# Patient Record
Sex: Male | Born: 1985 | Race: White | Hispanic: No | Marital: Single | Smoking: Current every day smoker
Health system: Southern US, Community
[De-identification: ages and names within clinical notes are randomized; demographics above are authoritative.]

## PROBLEM LIST (undated history)

## (undated) DIAGNOSIS — S2249XA Multiple fractures of ribs, unspecified side, initial encounter for closed fracture: Secondary | ICD-10-CM

## (undated) DIAGNOSIS — S060XAA Concussion with loss of consciousness status unknown, initial encounter: Secondary | ICD-10-CM

## (undated) DIAGNOSIS — S060X9A Concussion with loss of consciousness of unspecified duration, initial encounter: Secondary | ICD-10-CM

## (undated) DIAGNOSIS — S069X9A Unspecified intracranial injury with loss of consciousness of unspecified duration, initial encounter: Secondary | ICD-10-CM

## (undated) DIAGNOSIS — F419 Anxiety disorder, unspecified: Secondary | ICD-10-CM

## (undated) DIAGNOSIS — K219 Gastro-esophageal reflux disease without esophagitis: Secondary | ICD-10-CM

## (undated) DIAGNOSIS — J45909 Unspecified asthma, uncomplicated: Secondary | ICD-10-CM

## (undated) HISTORY — PX: NO PAST SURGERIES: SHX2092

---

## 2006-02-14 ENCOUNTER — Emergency Department: Payer: Self-pay | Admitting: Emergency Medicine

## 2006-05-18 ENCOUNTER — Emergency Department: Payer: Self-pay | Admitting: Unknown Physician Specialty

## 2008-09-04 ENCOUNTER — Emergency Department: Payer: Self-pay | Admitting: Internal Medicine

## 2008-12-05 ENCOUNTER — Emergency Department: Payer: Self-pay | Admitting: Emergency Medicine

## 2009-01-02 ENCOUNTER — Emergency Department: Payer: Self-pay | Admitting: Emergency Medicine

## 2010-08-23 ENCOUNTER — Emergency Department: Payer: Self-pay | Admitting: Emergency Medicine

## 2011-05-12 ENCOUNTER — Emergency Department: Payer: Self-pay | Admitting: Unknown Physician Specialty

## 2011-05-27 ENCOUNTER — Emergency Department: Payer: Self-pay | Admitting: Internal Medicine

## 2012-08-31 ENCOUNTER — Emergency Department: Payer: Self-pay | Admitting: Emergency Medicine

## 2012-12-30 ENCOUNTER — Emergency Department: Payer: Self-pay | Admitting: Emergency Medicine

## 2013-06-15 ENCOUNTER — Emergency Department: Payer: Self-pay | Admitting: Emergency Medicine

## 2013-07-29 ENCOUNTER — Emergency Department: Payer: Self-pay | Admitting: Emergency Medicine

## 2016-07-25 ENCOUNTER — Encounter: Payer: Self-pay | Admitting: Emergency Medicine

## 2016-07-25 ENCOUNTER — Emergency Department
Admission: EM | Admit: 2016-07-25 | Discharge: 2016-07-25 | Disposition: A | Discharge status: Home / Self Care | Payer: Self-pay | Attending: Emergency Medicine | Admitting: Emergency Medicine

## 2016-07-25 ENCOUNTER — Other Ambulatory Visit: Payer: Self-pay

## 2016-07-25 ENCOUNTER — Emergency Department: Payer: Self-pay

## 2016-07-25 DIAGNOSIS — F17210 Nicotine dependence, cigarettes, uncomplicated: Secondary | ICD-10-CM | POA: Insufficient documentation

## 2016-07-25 DIAGNOSIS — R062 Wheezing: Secondary | ICD-10-CM

## 2016-07-25 DIAGNOSIS — J40 Bronchitis, not specified as acute or chronic: Secondary | ICD-10-CM | POA: Insufficient documentation

## 2016-07-25 HISTORY — DX: Unspecified asthma, uncomplicated: J45.909

## 2016-07-25 LAB — CBC
HEMATOCRIT: 51.2 % (ref 40.0–52.0)
Hemoglobin: 17.5 g/dL (ref 13.0–18.0)
MCH: 31.6 pg (ref 26.0–34.0)
MCHC: 34.2 g/dL (ref 32.0–36.0)
MCV: 92.5 fL (ref 80.0–100.0)
Platelets: 136 10*3/uL — ABNORMAL LOW (ref 150–440)
RBC: 5.54 MIL/uL (ref 4.40–5.90)
RDW: 14.2 % (ref 11.5–14.5)
WBC: 9.7 10*3/uL (ref 3.8–10.6)

## 2016-07-25 LAB — BASIC METABOLIC PANEL
Anion gap: 6 (ref 5–15)
BUN: 14 mg/dL (ref 6–20)
CALCIUM: 9.7 mg/dL (ref 8.9–10.3)
CO2: 28 mmol/L (ref 22–32)
CREATININE: 1.13 mg/dL (ref 0.61–1.24)
Chloride: 105 mmol/L (ref 101–111)
GFR calc Af Amer: >60 mL/min (ref >60)
GLUCOSE: 91 mg/dL (ref 65–99)
Potassium: 4 mmol/L (ref 3.5–5.1)
Sodium: 139 mmol/L (ref 135–145)

## 2016-07-25 LAB — TROPONIN I: Troponin I: <0.03 ng/mL (ref <0.03)

## 2016-07-25 MED ORDER — IPRATROPIUM-ALBUTEROL 0.5-2.5 (3) MG/3ML IN SOLN
3.0000 mL | Freq: Once | RESPIRATORY_TRACT | Status: AC
  Administered 2016-07-25: 3 mL via RESPIRATORY_TRACT
  Filled 2016-07-25: qty 3

## 2016-07-25 MED ORDER — PREDNISONE 10 MG PO TABS: ORAL_TABLET | ORAL | 0 refills | Status: DC

## 2016-07-25 MED ORDER — ALBUTEROL SULFATE HFA 108 (90 BASE) MCG/ACT IN AERS: 2.0000 | INHALATION_SPRAY | Freq: Four times a day (QID) | RESPIRATORY_TRACT | 0 refills | Status: DC | PRN

## 2016-07-25 MED ORDER — PREDNISONE 20 MG PO TABS
40.0000 mg | ORAL_TABLET | Freq: Once | ORAL | Status: AC
  Administered 2016-07-25: 40 mg via ORAL
  Filled 2016-07-25: qty 2

## 2016-07-25 NOTE — Discharge Instructions (Signed)
You were evaluated for shortness of breath and found to be wheezing, which I suspect is from bronchospasm after viral upper respiratory infection.  Your examine evaluation are reassuring in the emergency department today.  Return to the emergency room for any worsening symptoms including fever, coughing up blood, trouble breathing, chest pain, dizziness or passing out, or any other symptoms concerning to you.

## 2016-07-25 NOTE — ED Notes (Signed)
Pt verbalized understanding of discharge instructions. NAD at this time. 

## 2016-07-25 NOTE — ED Provider Notes (Signed)
[facility] Emergency Department Provider Note ____________________________________________  Time seen: Approximately 2:30pm I have reviewed the triage vital signs and the triage nursing note.  HISTORY  Chief Complaint Chest Pain and Shortness of Breath   Historian Patient  HPI Joe Fowler. is a 30 y.o. male who denies past medical history, but states that he had symptoms of a viral upper respiratory infection with a sore throat and hoarse voice about 2 weeks ago, but symptoms have lingered and he has been short of breath. He has been using his son's albuterol which seems to help a little bit. He states he does not have an inhaler of his own. He states symptoms are worse outside in the heat. Some chest discomfort that the little bit worse with deep breathing. No lower extremity pain or swelling. No fever.  Symptoms are mild to moderate.    Past Medical History:  Diagnosis Date  . Asthma     There are no active problems to display for this patient.   History reviewed. No pertinent surgical history.  Prior to Admission medications   Medication Sig Start Date End Date Taking? Authorizing Provider  albuterol (PROVENTIL HFA;VENTOLIN HFA) 108 (90 Base) MCG/ACT inhaler Inhale 2 puffs into the lungs every 6 (six) hours as needed for wheezing or shortness of breath. 07/25/16   Governor Rooks, MD  predniSONE (DELTASONE) 10 MG tablet  daily for 4 days 07/25/16   Governor Rooks, MD    No Known Allergies  History reviewed. No pertinent family history.  Social History Social History  Substance Use Topics  . Smoking status: Current Every Day Smoker    Packs/day: 1.00    Types: Cigarettes  . Smokeless tobacco: Never Used  . Alcohol use Yes    Review of Systems  Constitutional: Negative for fever. Eyes: Negative for visual changes. ENT: Negative for sore throat. Cardiovascular: Some chest discomfort of somewhat described as pressure. Respiratory:  Positive for wheezing and coughing. Nonproductive of sputum. Gastrointestinal: Negative for abdominal pain, vomiting and diarrhea. Genitourinary: Negative for dysuria. Musculoskeletal: Negative for back pain. Skin: Negative for rash. Neurological: Negative for headache. 10 point Review of Systems otherwise negative ____________________________________________   PHYSICAL EXAM:  VITAL SIGNS: ED Triage Vitals  Enc Vitals Group     BP 07/25/16 1402 (!) 134/91     Pulse Rate 07/25/16 1402 69     Resp 07/25/16 1402 18     Temp 07/25/16 1402 98.4 F (36.9 C)     Temp src --      SpO2 07/25/16 1402 99 %     Weight 07/25/16 1402 175 lb (79.4 kg)     Height 07/25/16 1402  (1.803 m)     Head Circumference --      Peak Flow --      Pain Score 07/25/16 1403 3     Pain Loc --      Pain Edu? --      Excl. in GC? --      Constitutional: Alert and oriented. Well appearing and in no distress. HEENT   Head: Normocephalic and atraumatic.      Eyes: Conjunctivae are normal. PERRL. Normal extraocular movements.      Ears:         Nose: No congestion/rhinnorhea.   Mouth/Throat: Mucous membranes are moist.   Neck: No stridor. Cardiovascular/Chest: Normal rate, regular rhythm.  No murmurs, rubs, or gallops. Respiratory: Normal respiratory effort without tachypnea nor retractions. Moderate wheezing posteriorly  and sets off coughing when he takes a deep breath. Gastrointestinal: Soft. No distention, no guarding, no rebound. Nontender.    Genitourinary/rectal:Deferred Musculoskeletal: Nontender with normal range of motion in all extremities. No joint effusions.  No lower extremity tenderness.  No edema. Neurologic:  Normal speech and language. No gross or focal neurologic deficits are appreciated. Skin:  Skin is warm, dry and intact. No rash noted. Psychiatric: Mood and affect are normal. Speech and behavior are normal. Patient exhibits appropriate insight and  judgment.  ____________________________________________   EKG I, Governor Rooksebecca Austen Wygant, MD, the attending physician have personally viewed and interpreted all ECGs.  73 bpm. Sinus rhythm with sinus arrhythmia. Narrow QRS. Normal axis. Normal ST and T-wave , J-point elevation. ____________________________________________  LABS (pertinent positives/negatives)  Labs Reviewed  CBC - Abnormal; Notable for the following:       Result Value   Platelets 136 (*)    All other components within normal limits  BASIC METABOLIC PANEL  TROPONIN I    ____________________________________________  RADIOLOGY All Xrays were viewed by me. Imaging interpreted by Radiologist.  Chest x-ray two-view: Negative __________________________________________  PROCEDURES  Procedure(s) performed: None  Critical Care performed: None  ____________________________________________   ED COURSE / ASSESSMENT AND PLAN  Pertinent labs & imaging results that were available during my care of the patient were reviewed by me and considered in my medical decision making (see chart for details).   Patient's symptoms clinically sound like bronchitis/bronchospasm after recent upper respiratory infection.  ACS, PE, seem unlikely clinically.  No pneumonia or other infiltrate on chest x-ray.  Patient's wheezing was much improved after 2 DuoNeb treatments here.  Given the fact that he has been taking albuterol at home, I am going to go ahead and add prednisone. I will prescribe him his own albuterol inhaler.   CONSULTATIONS:   None Patient / Family / Caregiver informed of clinical course, medical decision-making process, and agree with plan.   I discussed return precautions, follow-up instructions, and discharged instructions with patient and/or family.   ___________________________________________   FINAL CLINICAL IMPRESSION(S) / ED DIAGNOSES   Final diagnoses:  Bronchitis  Wheezing               Note: This dictation was prepared with Dragon dictation. Any transcriptional errors that result from this process are unintentional    Governor Rooksebecca Thom Ollinger, MD 07/25/16 1517

## 2016-07-25 NOTE — ED Triage Notes (Signed)
Pt states for the past 2 weeks he has had cough, chills, shortness of breath and chest pain when coughing and taking a deep breath in. Pt reports he is out in the heat a lot then goes inside to the A/C.  Denies any fevers but reports getting the chills while he is outside.  Productive cough is clear sputum.

## 2016-08-28 ENCOUNTER — Emergency Department: Payer: Self-pay

## 2016-08-28 ENCOUNTER — Encounter: Payer: Self-pay | Admitting: Physician Assistant

## 2016-08-28 ENCOUNTER — Emergency Department
Admission: EM | Admit: 2016-08-28 | Discharge: 2016-08-28 | Disposition: A | Discharge status: Home / Self Care | Payer: Self-pay | Attending: Emergency Medicine | Admitting: Emergency Medicine

## 2016-08-28 DIAGNOSIS — F17210 Nicotine dependence, cigarettes, uncomplicated: Secondary | ICD-10-CM | POA: Insufficient documentation

## 2016-08-28 DIAGNOSIS — J069 Acute upper respiratory infection, unspecified: Secondary | ICD-10-CM | POA: Insufficient documentation

## 2016-08-28 DIAGNOSIS — J45901 Unspecified asthma with (acute) exacerbation: Secondary | ICD-10-CM | POA: Insufficient documentation

## 2016-08-28 MED ORDER — IPRATROPIUM-ALBUTEROL 0.5-2.5 (3) MG/3ML IN SOLN
RESPIRATORY_TRACT | Status: AC
  Administered 2016-08-28: 3 mL via RESPIRATORY_TRACT
  Filled 2016-08-28: qty 3

## 2016-08-28 MED ORDER — IPRATROPIUM-ALBUTEROL 0.5-2.5 (3) MG/3ML IN SOLN
3.0000 mL | Freq: Once | RESPIRATORY_TRACT | Status: AC
  Administered 2016-08-28: 3 mL via RESPIRATORY_TRACT

## 2016-08-28 MED ORDER — AZITHROMYCIN 250 MG PO TABS: ORAL_TABLET | ORAL | 0 refills | Status: DC

## 2016-08-28 MED ORDER — PREDNISONE 10 MG PO TABS: 50.0000 mg | ORAL_TABLET | Freq: Every day | ORAL | 0 refills | Status: DC

## 2016-08-28 MED ORDER — GUAIFENESIN-CODEINE 100-10 MG/5ML PO SOLN: 10.0000 mL | ORAL | 0 refills | Status: DC | PRN

## 2016-08-28 NOTE — ED Provider Notes (Signed)
[facility] Emergency Department Provider Note  ____________________________________________  Time seen: Approximately 2:53 PM  I have reviewed the triage vital signs and the nursing notes.   HISTORY  Chief Complaint Cough    HPI Joe Fowler. is a 30 y.o. male presents for evaluation of cough and wheezing for the last 6 weeks. Patient states he was seen here about 3 weeks ago but none DuoNeb and prednisone and still continues to cough. Patient denies any fever chills.   Past Medical History:  Diagnosis Date  . Asthma     There are no active problems to display for this patient.   History reviewed. No pertinent surgical history.  Prior to Admission medications   Medication Sig Start Date End Date Taking? Authorizing Provider  azithromycin (ZITHROMAX Z-PAK) 250 MG tablet Take 2 tablets (500 mg) on  Day 1,  followed by 1 tablet (250 mg) once daily on Days 2 through 5. 08/28/16   Charmayne Sheer Jaycee Mckellips, PA-C  guaiFENesin-codeine 100-10 MG/5ML syrup Take 10 mLs by mouth every 4 (four) hours as needed for cough. 08/28/16   Evangeline Dakin, PA-C  predniSONE (DELTASONE) 10 MG tablet Take 5 tablets (50 mg total) by mouth daily with breakfast. 08/28/16   Evangeline Dakin, PA-C    Allergies Review of patient's allergies indicates no known allergies.  No family history on file.  Social History Social History  Substance Use Topics  . Smoking status: Current Every Day Smoker    Packs/day: 1.00    Types: Cigarettes  . Smokeless tobacco: Never Used  . Alcohol use Yes    Review of Systems Constitutional: No fever/chills Eyes: No visual changes. ENT: No sore throat. Cardiovascular: Denies chest pain. Respiratory: Positive for shortness of breath positive for cough Gastrointestinal: No abdominal pain.  No nausea, no vomiting.  No diarrhea.  No constipation. Genitourinary: Negative for dysuria. Musculoskeletal: Negative for back pain. Skin: Negative for  rash. Neurological: Negative for headaches, focal weakness or numbness.  10-point ROS otherwise negative.  ____________________________________________   PHYSICAL EXAM:  VITAL SIGNS: ED Triage Vitals  Enc Vitals Group     BP 08/28/16 1415 124/84     Pulse Rate 08/28/16 1415 73     Resp 08/28/16 1415 16     Temp 08/28/16 1415 98.1 F (36.7 C)     Temp Source 08/28/16 1415 Oral     SpO2 08/28/16 1415 97 %     Weight 08/28/16 1416 185 lb (83.9 kg)     Height 08/28/16 1416 5\' 11"  (1.803 m)     Head Circumference --      Peak Flow --      Pain Score --      Pain Loc --      Pain Edu? --      Excl. in GC? --     Constitutional: Alert and oriented. Well appearing and in no acute distress. Head: Atraumatic. Nose: No congestion/rhinnorhea. Mouth/Throat: Mucous membranes are moist.  Oropharynx non-erythematous. Neck: No stridor.   Cardiovascular: Normal rate, regular rhythm. Grossly normal heart sounds.  Good peripheral circulation. Respiratory: Normal respiratory effort.  No retractions. Lungs Scattered wheezing noted bilaterally. Gastrointestinal: Soft and nontender. No distention. No abdominal bruits. No CVA tenderness. Musculoskeletal: No lower extremity tenderness nor edema.  No joint effusions. Neurologic:  Normal speech and language. No gross focal neurologic deficits are appreciated. No gait instability. Skin:  Skin is warm, dry and intact. No rash noted. Psychiatric: Mood and affect  are normal. Speech and behavior are normal.  ____________________________________________   LABS (all labs ordered are listed, but only abnormal results are displayed)  Labs Reviewed - No data to display ____________________________________________  EKG   ____________________________________________  RADIOLOGY   ____________________________________________   PROCEDURES  Procedure(s) performed: None  Critical Care performed:  No  ____________________________________________   INITIAL IMPRESSION / ASSESSMENT AND PLAN / ED COURSE  Pertinent labs & imaging results that were available during my care of the patient were reviewed by me and considered in my medical decision making (see chart for details). Review of the [place] CSRS was performed in accordance of the [facility] prior to dispensing any controlled drugs.  Acute exacerbation of asthma with wheezing and upper restaurant infection. Rx given for Z-Pak, Robitussin-AC and prednisone 5 day dosing. Patient follow-up with PCP or return to ER with any worsening symptomology.  Clinical Course  Patient was given DuoNeb treatment  ____________________________________________   FINAL CLINICAL IMPRESSION(S) / ED DIAGNOSES  Final diagnoses:  Asthma exacerbation  URI (upper respiratory infection)     This chart was dictated using voice recognition software/Dragon. Despite best efforts to proofread, errors can occur which can change the meaning. Any change was purely unintentional.    Evangeline Dakinharles M Hill Mackie, PA-C 08/28/16 1611    Jeanmarie PlantJames A McShane, MD 08/29/16 [id number]

## 2016-08-28 NOTE — ED Notes (Signed)
States he was dx'd with bronchitis about 3 weeks ago  States sxs' returned soon after finishing meds   Cough present on arrival  Afebrile on arrival

## 2016-08-28 NOTE — ED Triage Notes (Addendum)
Pt reports for 6 weeks dry cough, congestion and headache that comes and goes for 6 weeks . States she quit smoking yesterday due to the constant cough. No fever.

## 2016-12-30 ENCOUNTER — Encounter: Payer: Self-pay | Admitting: Emergency Medicine

## 2016-12-30 DIAGNOSIS — F17210 Nicotine dependence, cigarettes, uncomplicated: Secondary | ICD-10-CM | POA: Insufficient documentation

## 2016-12-30 DIAGNOSIS — Z5321 Procedure and treatment not carried out due to patient leaving prior to being seen by health care provider: Secondary | ICD-10-CM | POA: Insufficient documentation

## 2016-12-30 DIAGNOSIS — K0889 Other specified disorders of teeth and supporting structures: Secondary | ICD-10-CM | POA: Insufficient documentation

## 2016-12-30 DIAGNOSIS — J45909 Unspecified asthma, uncomplicated: Secondary | ICD-10-CM | POA: Insufficient documentation

## 2016-12-30 DIAGNOSIS — Z79899 Other long term (current) drug therapy: Secondary | ICD-10-CM | POA: Insufficient documentation

## 2016-12-30 NOTE — ED Triage Notes (Signed)
Pt ambulatory to triage with steady gait, no distress noted. Pt c/o right lower wisdom tooth pain x1 day. On assessment pt has tooth coming through gum in wisdom tooth area with swelling around area.

## 2016-12-31 ENCOUNTER — Emergency Department
Admission: EM | Admit: 2016-12-31 | Discharge: 2016-12-31 | Disposition: A | Discharge status: Home / Self Care | Payer: Self-pay | Attending: Emergency Medicine | Admitting: Emergency Medicine

## 2018-03-26 ENCOUNTER — Encounter (HOSPITAL_COMMUNITY): Payer: Self-pay

## 2018-03-26 ENCOUNTER — Other Ambulatory Visit: Payer: Self-pay

## 2018-03-26 ENCOUNTER — Emergency Department (HOSPITAL_COMMUNITY): Payer: No Typology Code available for payment source

## 2018-03-26 ENCOUNTER — Observation Stay (HOSPITAL_COMMUNITY)
Admission: EM | Admit: 2018-03-26 | Discharge: 2018-03-27 | Disposition: A | Discharge status: Home / Self Care | Payer: No Typology Code available for payment source | Attending: General Surgery | Admitting: General Surgery

## 2018-03-26 DIAGNOSIS — S20319A Abrasion of unspecified front wall of thorax, initial encounter: Secondary | ICD-10-CM | POA: Diagnosis not present

## 2018-03-26 DIAGNOSIS — S2249XA Multiple fractures of ribs, unspecified side, initial encounter for closed fracture: Secondary | ICD-10-CM | POA: Diagnosis present

## 2018-03-26 DIAGNOSIS — J90 Pleural effusion, not elsewhere classified: Secondary | ICD-10-CM | POA: Diagnosis not present

## 2018-03-26 DIAGNOSIS — S0081XA Abrasion of other part of head, initial encounter: Secondary | ICD-10-CM | POA: Insufficient documentation

## 2018-03-26 DIAGNOSIS — S30811A Abrasion of abdominal wall, initial encounter: Secondary | ICD-10-CM | POA: Insufficient documentation

## 2018-03-26 DIAGNOSIS — S80212A Abrasion, left knee, initial encounter: Secondary | ICD-10-CM | POA: Diagnosis not present

## 2018-03-26 DIAGNOSIS — S2242XA Multiple fractures of ribs, left side, initial encounter for closed fracture: Secondary | ICD-10-CM | POA: Insufficient documentation

## 2018-03-26 DIAGNOSIS — S060X1A Concussion with loss of consciousness of 30 minutes or less, initial encounter: Secondary | ICD-10-CM | POA: Insufficient documentation

## 2018-03-26 DIAGNOSIS — F17210 Nicotine dependence, cigarettes, uncomplicated: Secondary | ICD-10-CM | POA: Insufficient documentation

## 2018-03-26 DIAGNOSIS — N281 Cyst of kidney, acquired: Secondary | ICD-10-CM | POA: Insufficient documentation

## 2018-03-26 DIAGNOSIS — S27321A Contusion of lung, unilateral, initial encounter: Secondary | ICD-10-CM

## 2018-03-26 DIAGNOSIS — S069X9A Unspecified intracranial injury with loss of consciousness of unspecified duration, initial encounter: Secondary | ICD-10-CM

## 2018-03-26 DIAGNOSIS — M898X2 Other specified disorders of bone, upper arm: Secondary | ICD-10-CM | POA: Insufficient documentation

## 2018-03-26 DIAGNOSIS — S270XXA Traumatic pneumothorax, initial encounter: Secondary | ICD-10-CM | POA: Diagnosis not present

## 2018-03-26 DIAGNOSIS — M25522 Pain in left elbow: Secondary | ICD-10-CM | POA: Diagnosis not present

## 2018-03-26 DIAGNOSIS — Y9355 Activity, bike riding: Secondary | ICD-10-CM | POA: Diagnosis not present

## 2018-03-26 DIAGNOSIS — S8292XA Unspecified fracture of left lower leg, initial encounter for closed fracture: Secondary | ICD-10-CM

## 2018-03-26 DIAGNOSIS — J45909 Unspecified asthma, uncomplicated: Secondary | ICD-10-CM | POA: Insufficient documentation

## 2018-03-26 DIAGNOSIS — S42022A Displaced fracture of shaft of left clavicle, initial encounter for closed fracture: Secondary | ICD-10-CM | POA: Diagnosis not present

## 2018-03-26 DIAGNOSIS — S060X9A Concussion with loss of consciousness of unspecified duration, initial encounter: Secondary | ICD-10-CM

## 2018-03-26 DIAGNOSIS — S40812A Abrasion of left upper arm, initial encounter: Secondary | ICD-10-CM | POA: Diagnosis not present

## 2018-03-26 DIAGNOSIS — S80211A Abrasion, right knee, initial encounter: Secondary | ICD-10-CM | POA: Diagnosis not present

## 2018-03-26 DIAGNOSIS — S40811A Abrasion of right upper arm, initial encounter: Secondary | ICD-10-CM | POA: Insufficient documentation

## 2018-03-26 DIAGNOSIS — T07XXXA Unspecified multiple injuries, initial encounter: Secondary | ICD-10-CM

## 2018-03-26 DIAGNOSIS — S0003XA Contusion of scalp, initial encounter: Secondary | ICD-10-CM | POA: Insufficient documentation

## 2018-03-26 DIAGNOSIS — F1010 Alcohol abuse, uncomplicated: Secondary | ICD-10-CM | POA: Insufficient documentation

## 2018-03-26 HISTORY — DX: Unspecified intracranial injury with loss of consciousness of unspecified duration, initial encounter: S06.9X9A

## 2018-03-26 LAB — CBC WITH DIFFERENTIAL/PLATELET
BASOS ABS: 0 10*3/uL (ref 0.0–0.1)
Basophils Relative: 0 %
EOS PCT: 0 %
Eosinophils Absolute: 0.1 10*3/uL (ref 0.0–0.7)
HCT: 46 % (ref 39.0–52.0)
Hemoglobin: 15.5 g/dL (ref 13.0–17.0)
LYMPHS PCT: 9 %
Lymphs Abs: 2 10*3/uL (ref 0.7–4.0)
MCH: 31.5 pg (ref 26.0–34.0)
MCHC: 33.7 g/dL (ref 30.0–36.0)
MCV: 93.5 fL (ref 78.0–100.0)
Monocytes Absolute: 1.5 10*3/uL — ABNORMAL HIGH (ref 0.1–1.0)
Monocytes Relative: 7 %
Neutro Abs: 17.4 10*3/uL — ABNORMAL HIGH (ref 1.7–7.7)
Neutrophils Relative %: 84 %
PLATELETS: 159 10*3/uL (ref 150–400)
RBC: 4.92 MIL/uL (ref 4.22–5.81)
RDW: 14 % (ref 11.5–15.5)
WBC: 20.9 10*3/uL — ABNORMAL HIGH (ref 4.0–10.5)

## 2018-03-26 LAB — COMPREHENSIVE METABOLIC PANEL
ALT: 19 U/L (ref 17–63)
AST: 52 U/L — ABNORMAL HIGH (ref 15–41)
Albumin: 3.7 g/dL (ref 3.5–5.0)
Alkaline Phosphatase: 66 U/L (ref 38–126)
Anion gap: 12 (ref 5–15)
BUN: 12 mg/dL (ref 6–20)
CHLORIDE: 102 mmol/L (ref 101–111)
CO2: 21 mmol/L — AB (ref 22–32)
CREATININE: 1.19 mg/dL (ref 0.61–1.24)
Calcium: 8.7 mg/dL — ABNORMAL LOW (ref 8.9–10.3)
GFR calc Af Amer: >60 mL/min (ref >60)
GLUCOSE: 132 mg/dL — AB (ref 65–99)
Potassium: 4.2 mmol/L (ref 3.5–5.1)
SODIUM: 135 mmol/L (ref 135–145)
Total Bilirubin: 0.6 mg/dL (ref 0.3–1.2)
Total Protein: 6.3 g/dL — ABNORMAL LOW (ref 6.5–8.1)

## 2018-03-26 MED ORDER — IOPAMIDOL (ISOVUE-300) INJECTION 61%
INTRAVENOUS | Status: AC
  Administered 2018-03-26: 100 mL
  Filled 2018-03-26: qty 100

## 2018-03-26 MED ORDER — KCL IN DEXTROSE-NACL 20-5-0.45 MEQ/L-%-% IV SOLN
INTRAVENOUS | Status: DC
  Administered 2018-03-26 – 2018-03-27 (×2): via INTRAVENOUS
  Filled 2018-03-26 (×3): qty 1000

## 2018-03-26 MED ORDER — PANTOPRAZOLE SODIUM 40 MG PO TBEC
40.0000 mg | DELAYED_RELEASE_TABLET | Freq: Every day | ORAL | Status: DC
  Administered 2018-03-27: 40 mg via ORAL
  Filled 2018-03-26: qty 1

## 2018-03-26 MED ORDER — KETOROLAC TROMETHAMINE 30 MG/ML IJ SOLN
30.0000 mg | Freq: Once | INTRAMUSCULAR | Status: AC
  Administered 2018-03-26: 30 mg via INTRAVENOUS
  Filled 2018-03-26: qty 1

## 2018-03-26 MED ORDER — TRAMADOL HCL 50 MG PO TABS
50.0000 mg | ORAL_TABLET | Freq: Four times a day (QID) | ORAL | Status: DC
  Administered 2018-03-27 (×3): 50 mg via ORAL
  Filled 2018-03-26 (×3): qty 1

## 2018-03-26 MED ORDER — KETOROLAC TROMETHAMINE 15 MG/ML IJ SOLN
15.0000 mg | Freq: Four times a day (QID) | INTRAMUSCULAR | Status: DC
Start: 2018-03-27 — End: 2018-03-27
  Administered 2018-03-27 (×2): 15 mg via INTRAVENOUS
  Filled 2018-03-26 (×2): qty 1

## 2018-03-26 MED ORDER — ONDANSETRON HCL 4 MG/2ML IJ SOLN
4.0000 mg | Freq: Four times a day (QID) | INTRAMUSCULAR | Status: DC | PRN
  Administered 2018-03-27: 4 mg via INTRAVENOUS
  Filled 2018-03-26: qty 2

## 2018-03-26 MED ORDER — HYDROMORPHONE HCL 1 MG/ML IJ SOLN
1.0000 mg | INTRAMUSCULAR | Status: DC | PRN
  Administered 2018-03-26: 2 mg via INTRAVENOUS
  Administered 2018-03-27 (×2): 1 mg via INTRAVENOUS
  Administered 2018-03-27: 2 mg via INTRAVENOUS
  Filled 2018-03-26 (×2): qty 1
  Filled 2018-03-26 (×2): qty 2

## 2018-03-26 MED ORDER — TETANUS-DIPHTH-ACELL PERTUSSIS 5-2.5-18.5 LF-MCG/0.5 IM SUSP
0.5000 mL | Freq: Once | INTRAMUSCULAR | Status: AC
  Administered 2018-03-26: 0.5 mL via INTRAMUSCULAR
  Filled 2018-03-26: qty 0.5

## 2018-03-26 MED ORDER — OXYCODONE HCL 5 MG PO TABS
10.0000 mg | ORAL_TABLET | ORAL | Status: DC | PRN
  Administered 2018-03-26 – 2018-03-27 (×2): 10 mg via ORAL
  Filled 2018-03-26 (×2): qty 2

## 2018-03-26 MED ORDER — HYDROMORPHONE HCL 1 MG/ML IJ SOLN
1.0000 mg | Freq: Once | INTRAMUSCULAR | Status: AC
  Administered 2018-03-26: 1 mg via INTRAVENOUS
  Filled 2018-03-26: qty 1

## 2018-03-26 MED ORDER — DOCUSATE SODIUM 100 MG PO CAPS
100.0000 mg | ORAL_CAPSULE | Freq: Two times a day (BID) | ORAL | Status: DC
  Administered 2018-03-26 – 2018-03-27 (×2): 100 mg via ORAL
  Filled 2018-03-26 (×2): qty 1

## 2018-03-26 MED ORDER — ENOXAPARIN SODIUM 40 MG/0.4ML ~~LOC~~ SOLN
40.0000 mg | SUBCUTANEOUS | Status: DC
  Filled 2018-03-26: qty 0.4

## 2018-03-26 MED ORDER — OXYCODONE HCL 5 MG PO TABS: 5.0000 mg | ORAL_TABLET | ORAL | Status: DC | PRN

## 2018-03-26 MED ORDER — ONDANSETRON 4 MG PO TBDP: 4.0000 mg | ORAL_TABLET | Freq: Four times a day (QID) | ORAL | Status: DC | PRN

## 2018-03-26 MED ORDER — ONDANSETRON HCL 4 MG/2ML IJ SOLN
4.0000 mg | Freq: Once | INTRAMUSCULAR | Status: AC
  Administered 2018-03-26: 4 mg via INTRAVENOUS
  Filled 2018-03-26: qty 2

## 2018-03-26 MED ORDER — BISACODYL 10 MG RE SUPP: 10.0000 mg | Freq: Every day | RECTAL | Status: DC | PRN

## 2018-03-26 MED ORDER — PANTOPRAZOLE SODIUM 40 MG IV SOLR: 40.0000 mg | Freq: Every day | INTRAVENOUS | Status: DC
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
## 2018-03-26 NOTE — ED Notes (Signed)
Patient transported to X-ray 

## 2018-03-26 NOTE — ED Notes (Signed)
The pt is c/o lr chest pain

## 2018-03-26 NOTE — ED Notes (Signed)
Pain med given 

## 2018-03-27 ENCOUNTER — Observation Stay (HOSPITAL_COMMUNITY): Payer: No Typology Code available for payment source

## 2018-03-27 DIAGNOSIS — S270XXA Traumatic pneumothorax, initial encounter: Secondary | ICD-10-CM

## 2018-03-27 DIAGNOSIS — S42022A Displaced fracture of shaft of left clavicle, initial encounter for closed fracture: Secondary | ICD-10-CM | POA: Diagnosis present

## 2018-03-27 LAB — BASIC METABOLIC PANEL
ANION GAP: 12 (ref 5–15)
BUN: 16 mg/dL (ref 6–20)
CALCIUM: 8.6 mg/dL — AB (ref 8.9–10.3)
CHLORIDE: 101 mmol/L (ref 101–111)
CO2: 22 mmol/L (ref 22–32)
CREATININE: 1.08 mg/dL (ref 0.61–1.24)
GFR calc Af Amer: >60 mL/min (ref >60)
GFR calc non Af Amer: >60 mL/min (ref >60)
GLUCOSE: 162 mg/dL — AB (ref 65–99)
Potassium: 3.7 mmol/L (ref 3.5–5.1)
Sodium: 135 mmol/L (ref 135–145)

## 2018-03-27 LAB — CBC
HEMATOCRIT: 43.2 % (ref 39.0–52.0)
HEMOGLOBIN: 14.3 g/dL (ref 13.0–17.0)
MCH: 31.2 pg (ref 26.0–34.0)
MCHC: 33.1 g/dL (ref 30.0–36.0)
MCV: 94.3 fL (ref 78.0–100.0)
Platelets: 148 10*3/uL — ABNORMAL LOW (ref 150–400)
RBC: 4.58 MIL/uL (ref 4.22–5.81)
RDW: 14.3 % (ref 11.5–15.5)
WBC: 15.4 10*3/uL — ABNORMAL HIGH (ref 4.0–10.5)

## 2018-03-27 MED ORDER — OXYCODONE HCL 10 MG PO TABS: 10.0000 mg | ORAL_TABLET | Freq: Four times a day (QID) | ORAL | 0 refills | Status: DC | PRN

## 2018-03-27 MED ORDER — TRAMADOL HCL 50 MG PO TABS: 50.0000 mg | ORAL_TABLET | Freq: Four times a day (QID) | ORAL | 0 refills | Status: DC | PRN

## 2018-03-27 MED ORDER — NICOTINE 21 MG/24HR TD PT24
21.0000 mg | MEDICATED_PATCH | Freq: Every day | TRANSDERMAL | Status: DC
  Administered 2018-03-27 (×2): 21 mg via TRANSDERMAL
  Filled 2018-03-27 (×2): qty 1
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
## 2018-03-27 NOTE — Discharge Instructions (Signed)
Rib Fracture A rib fracture is a break or crack in one of the bones of the ribs. The ribs are like a cage that goes around your upper chest. A broken or cracked rib is often painful, but most do not cause other problems. Most rib fractures heal on their own in 1-3 months. Follow these instructions at home:  Avoid activities that cause pain to the injured area. Protect your injured area.  Slowly increase activity as told by your doctor.  Take medicine as told by your doctor.  Put ice on the injured area for the first 1-2 days after you have been treated or as told by your doctor. ? Put ice in a plastic bag. ? Place a towel between your skin and the bag. ? Leave the ice on for 15-20 minutes at a time, every 2 hours while you are awake.  Do deep breathing as told by your doctor. You may be told to: ? Take deep breaths many times a day. ? Cough many times a day while hugging a pillow. ? Use a device (incentive spirometer) to perform deep breathing many times a day.  Drink enough fluids to keep your pee (urine) clear or pale yellow.  Do not wear a rib belt or binder. These do not allow you to breathe deeply. Get help right away if:  You have a fever.  You have trouble breathing.  You cannot stop coughing.  You cough up thick or bloody spit (mucus).  You feel sick to your stomach (nauseous), throw up (vomit), or have belly (abdominal) pain.  Your pain gets worse and medicine does not help. This information is not intended to replace advice given to you by your health care provider. Make sure you discuss any questions you have with your health care provider. Document Released: 09/14/2008 Document Revised: 05/13/2016 Document Reviewed: 02/07/2013 Elsevier Interactive Patient Education  2018 [employer].   How to Use a Clavicle Strap A clavicle strap is a bandage that wraps around the shoulders and upper back to prevent an injured collarbone (clavicle) from moving while it heals.  You may need to use a clavicle strap if you have a broken (fractured) collarbone. A clavicle strap is typically made from a wide, elastic bandage. It holds your collarbone still (immobile) by pulling your shoulders back. The strap is fastened tightly on your upper back. What are the risks? A clavicle strap can sometimes result in chafing and skin irritation under the strap. To help prevent this, put baby powder or cornstarch on your skin as told by your health care provider. How to use a clavicle strap  Wear the strap as told by your health care provider. You may need to wear it most of the time for 4-8 weeks.  Put on and take off the strap as told by your health care provider. ? Make sure the tension of the strap is not so tight that it causes pain. ? Adjust the tension of the strap to provide proper support to the shoulders as swelling goes down.  Limit arm and shoulder movements while wearing the strap as told by your health care provider.  Remove the strap for bathing.  Clean and powder your skin often to prevent chafing and skin irritation.  Remove and clean the strap periodically. Contact a health care provider if:  You have any areas of broken skin from the clavicle strap.  You have pain when moving or using the clavicle strap.  Your pain is getting worse  or is not improving over time. Get help right away if:  You have numbness, coolness, or discoloration of your hands or arms. This information is not intended to replace advice given to you by your health care provider. Make sure you discuss any questions you have with your health care provider. Document Released: 01/13/2005 Document Revised: 01/07/2016 Document Reviewed: 07/30/2015 Elsevier Interactive Patient Education  [employer]2018 [employer] Inc[employer]
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
## 2018-03-28 ENCOUNTER — Encounter (HOSPITAL_COMMUNITY): Payer: Self-pay

## 2018-03-28 ENCOUNTER — Emergency Department (HOSPITAL_COMMUNITY)
Admission: EM | Admit: 2018-03-28 | Discharge: 2018-03-29 | Disposition: A | Discharge status: Home / Self Care | Payer: No Typology Code available for payment source | Attending: Emergency Medicine | Admitting: Emergency Medicine

## 2018-03-28 ENCOUNTER — Emergency Department (HOSPITAL_COMMUNITY): Payer: No Typology Code available for payment source

## 2018-03-28 DIAGNOSIS — Y999 Unspecified external cause status: Secondary | ICD-10-CM | POA: Diagnosis not present

## 2018-03-28 DIAGNOSIS — Y929 Unspecified place or not applicable: Secondary | ICD-10-CM | POA: Diagnosis not present

## 2018-03-28 DIAGNOSIS — Z79899 Other long term (current) drug therapy: Secondary | ICD-10-CM | POA: Insufficient documentation

## 2018-03-28 DIAGNOSIS — F17210 Nicotine dependence, cigarettes, uncomplicated: Secondary | ICD-10-CM | POA: Diagnosis not present

## 2018-03-28 DIAGNOSIS — L03116 Cellulitis of left lower limb: Secondary | ICD-10-CM | POA: Diagnosis not present

## 2018-03-28 DIAGNOSIS — J45909 Unspecified asthma, uncomplicated: Secondary | ICD-10-CM | POA: Diagnosis not present

## 2018-03-28 DIAGNOSIS — S2242XD Multiple fractures of ribs, left side, subsequent encounter for fracture with routine healing: Secondary | ICD-10-CM | POA: Insufficient documentation

## 2018-03-28 DIAGNOSIS — Y9389 Activity, other specified: Secondary | ICD-10-CM | POA: Insufficient documentation

## 2018-03-28 LAB — I-STAT TROPONIN, ED: TROPONIN I, POC: 0 ng/mL (ref 0.00–0.08)

## 2018-03-28 LAB — BASIC METABOLIC PANEL
ANION GAP: 12 (ref 5–15)
BUN: 10 mg/dL (ref 6–20)
CHLORIDE: 98 mmol/L — AB (ref 101–111)
CO2: 25 mmol/L (ref 22–32)
Calcium: 9 mg/dL (ref 8.9–10.3)
Creatinine, Ser: 1.02 mg/dL (ref 0.61–1.24)
GFR calc non Af Amer: >60 mL/min (ref >60)
Glucose, Bld: 100 mg/dL — ABNORMAL HIGH (ref 65–99)
POTASSIUM: 3.7 mmol/L (ref 3.5–5.1)
SODIUM: 135 mmol/L (ref 135–145)

## 2018-03-28 LAB — CBC WITH DIFFERENTIAL/PLATELET
BASOS PCT: 0 %
Basophils Absolute: 0 10*3/uL (ref 0.0–0.1)
EOS ABS: 0.1 10*3/uL (ref 0.0–0.7)
Eosinophils Relative: 1 %
HCT: 43.2 % (ref 39.0–52.0)
HEMOGLOBIN: 14.3 g/dL (ref 13.0–17.0)
Lymphocytes Relative: 21 %
Lymphs Abs: 2.4 10*3/uL (ref 0.7–4.0)
MCH: 31.1 pg (ref 26.0–34.0)
MCHC: 33.1 g/dL (ref 30.0–36.0)
MCV: 93.9 fL (ref 78.0–100.0)
Monocytes Absolute: 1.3 10*3/uL — ABNORMAL HIGH (ref 0.1–1.0)
Monocytes Relative: 11 %
NEUTROS PCT: 67 %
Neutro Abs: 7.6 10*3/uL (ref 1.7–7.7)
Platelets: 137 10*3/uL — ABNORMAL LOW (ref 150–400)
RBC: 4.6 MIL/uL (ref 4.22–5.81)
RDW: 14.1 % (ref 11.5–15.5)
WBC: 11.4 10*3/uL — AB (ref 4.0–10.5)

## 2018-03-28 MED ORDER — KETOROLAC TROMETHAMINE 30 MG/ML IJ SOLN
30.0000 mg | Freq: Once | INTRAMUSCULAR | Status: AC
  Administered 2018-03-28: 30 mg via INTRAVENOUS
  Filled 2018-03-28: qty 1

## 2018-03-28 MED ORDER — HYDROMORPHONE HCL 1 MG/ML IJ SOLN
0.5000 mg | Freq: Once | INTRAMUSCULAR | Status: AC
  Administered 2018-03-28: 0.5 mg via INTRAVENOUS
  Filled 2018-03-28: qty 1
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
## 2018-03-29 MED ORDER — DOXYCYCLINE HYCLATE 100 MG PO CAPS: 100.0000 mg | ORAL_CAPSULE | Freq: Two times a day (BID) | ORAL | 0 refills | Status: DC

## 2018-03-29 MED ORDER — IBUPROFEN 800 MG PO TABS: 800.0000 mg | ORAL_TABLET | Freq: Three times a day (TID) | ORAL | 0 refills | Status: DC

## 2018-03-29 NOTE — ED Notes (Signed)
Signature pad not working. 

## 2018-03-29 NOTE — Discharge Instructions (Signed)
It was my pleasure taking care of you today!    Please take all of your antibiotics until finished! Monitor the wound on your leg. If redness gets worse, please return to ER for recheck.   Continue your home oxycodone / tramadol pain regimen. In addition to this, begin taking 1000 mg Tylenol three times daily and 800 mg ibuprofen three times daily.   Continue using your incentive spirometer.  If pain is still not controlled, please call the trauma clinic listed to schedule an appointment.   Return to ER for new or worsening symptoms, any additional concerns.

## 2018-03-29 NOTE — ED Notes (Signed)
PT states understanding of care given, follow up care, and medication prescribed. PT ambulated from ED to car with a steady gait. 

## 2018-03-31 ENCOUNTER — Encounter (HOSPITAL_COMMUNITY): Payer: Self-pay | Admitting: *Deleted

## 2018-03-31 ENCOUNTER — Emergency Department (HOSPITAL_COMMUNITY)
Admission: EM | Admit: 2018-03-31 | Discharge: 2018-03-31 | Disposition: A | Discharge status: Home / Self Care | Payer: Self-pay | Attending: Emergency Medicine | Admitting: Emergency Medicine

## 2018-03-31 ENCOUNTER — Other Ambulatory Visit: Payer: Self-pay

## 2018-03-31 ENCOUNTER — Emergency Department (HOSPITAL_COMMUNITY): Payer: Self-pay

## 2018-03-31 DIAGNOSIS — Y999 Unspecified external cause status: Secondary | ICD-10-CM | POA: Insufficient documentation

## 2018-03-31 DIAGNOSIS — W19XXXA Unspecified fall, initial encounter: Secondary | ICD-10-CM

## 2018-03-31 DIAGNOSIS — F17210 Nicotine dependence, cigarettes, uncomplicated: Secondary | ICD-10-CM | POA: Insufficient documentation

## 2018-03-31 DIAGNOSIS — W01190A Fall on same level from slipping, tripping and stumbling with subsequent striking against furniture, initial encounter: Secondary | ICD-10-CM | POA: Insufficient documentation

## 2018-03-31 DIAGNOSIS — Y929 Unspecified place or not applicable: Secondary | ICD-10-CM | POA: Insufficient documentation

## 2018-03-31 DIAGNOSIS — S42022A Displaced fracture of shaft of left clavicle, initial encounter for closed fracture: Secondary | ICD-10-CM | POA: Insufficient documentation

## 2018-03-31 DIAGNOSIS — J45909 Unspecified asthma, uncomplicated: Secondary | ICD-10-CM | POA: Insufficient documentation

## 2018-03-31 DIAGNOSIS — Y9389 Activity, other specified: Secondary | ICD-10-CM | POA: Insufficient documentation

## 2018-03-31 DIAGNOSIS — B370 Candidal stomatitis: Secondary | ICD-10-CM

## 2018-03-31 MED ORDER — OXYCODONE HCL 10 MG PO TABS: 10.0000 mg | ORAL_TABLET | Freq: Four times a day (QID) | ORAL | 0 refills | Status: DC | PRN

## 2018-03-31 MED ORDER — NYSTATIN 100000 UNIT/ML MT SUSP: 500000.0000 [IU] | Freq: Four times a day (QID) | OROMUCOSAL | 0 refills | Status: AC

## 2018-03-31 NOTE — Anesthesia Preprocedure Evaluation (Addendum)
Anesthesia Evaluation  Patient identified by MRN, date of birth, ID band Patient awake    Reviewed: Allergy & Precautions, H&P , NPO status , Patient's Chart, lab work & pertinent test results  Airway Mallampati: II  TM Distance: >3 FB Neck ROM: Full    Dental no notable dental hx. (+) Teeth Intact, Dental Advisory Given   Pulmonary Current Smoker,    Pulmonary exam normal breath sounds clear to auscultation       Cardiovascular Exercise Tolerance: Good negative cardio ROS   Rhythm:Regular Rate:Normal     Neuro/Psych Anxiety negative neurological ROS     GI/Hepatic negative GI ROS, Neg liver ROS, GERD  Controlled,  Endo/Other  negative endocrine ROS  Renal/GU negative Renal ROS  negative genitourinary   Musculoskeletal   Abdominal   Peds  Hematology negative hematology ROS (+)   Anesthesia Other Findings   Reproductive/Obstetrics negative OB ROS                            Anesthesia Physical Anesthesia Plan  ASA: II  Anesthesia Plan: General   Post-op Pain Management:    Induction: Intravenous  PONV Risk Score and Plan: 2 and Ondansetron, Dexamethasone and Midazolam  Airway Management Planned: Oral ETT  Additional Equipment:   Intra-op Plan:   Post-operative Plan: Extubation in OR  Informed Consent: I have reviewed the patients History and Physical, chart, labs and discussed the procedure including the risks, benefits and alternatives for the proposed anesthesia with the patient or authorized representative who has indicated his/her understanding and acceptance.   Dental advisory given  Plan Discussed with: CRNA  Anesthesia Plan Comments:         Anesthesia Quick Evaluation
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
## 2018-03-31 NOTE — Discharge Instructions (Signed)
Follow-up tomorrow morning as discussed for the surgery.  Do not eat after midnight.

## 2018-04-01 ENCOUNTER — Ambulatory Visit (HOSPITAL_COMMUNITY): Payer: Self-pay

## 2018-04-01 ENCOUNTER — Ambulatory Visit (HOSPITAL_COMMUNITY)
Admission: RE | Admit: 2018-04-01 | Discharge: 2018-04-01 | Disposition: A | Discharge status: Home / Self Care | Payer: Self-pay | Source: Ambulatory Visit | Attending: Orthopedic Surgery | Admitting: Orthopedic Surgery

## 2018-04-01 ENCOUNTER — Ambulatory Visit (HOSPITAL_COMMUNITY): Payer: Self-pay | Admitting: Anesthesiology

## 2018-04-01 ENCOUNTER — Encounter (HOSPITAL_COMMUNITY)
Admission: RE | Disposition: A | Discharge status: Home / Self Care | Payer: Self-pay | Source: Ambulatory Visit | Attending: Orthopedic Surgery

## 2018-04-01 ENCOUNTER — Encounter (HOSPITAL_COMMUNITY): Payer: Self-pay | Admitting: *Deleted

## 2018-04-01 DIAGNOSIS — Z9889 Other specified postprocedural states: Secondary | ICD-10-CM

## 2018-04-01 DIAGNOSIS — J45909 Unspecified asthma, uncomplicated: Secondary | ICD-10-CM | POA: Insufficient documentation

## 2018-04-01 DIAGNOSIS — F17210 Nicotine dependence, cigarettes, uncomplicated: Secondary | ICD-10-CM | POA: Insufficient documentation

## 2018-04-01 DIAGNOSIS — S42022A Displaced fracture of shaft of left clavicle, initial encounter for closed fracture: Secondary | ICD-10-CM | POA: Diagnosis present

## 2018-04-01 DIAGNOSIS — Z419 Encounter for procedure for purposes other than remedying health state, unspecified: Secondary | ICD-10-CM

## 2018-04-01 DIAGNOSIS — S42002A Fracture of unspecified part of left clavicle, initial encounter for closed fracture: Secondary | ICD-10-CM | POA: Insufficient documentation

## 2018-04-01 DIAGNOSIS — F419 Anxiety disorder, unspecified: Secondary | ICD-10-CM | POA: Insufficient documentation

## 2018-04-01 DIAGNOSIS — K219 Gastro-esophageal reflux disease without esophagitis: Secondary | ICD-10-CM | POA: Insufficient documentation

## 2018-04-01 DIAGNOSIS — Z8781 Personal history of (healed) traumatic fracture: Secondary | ICD-10-CM

## 2018-04-01 HISTORY — DX: Concussion with loss of consciousness of unspecified duration, initial encounter: S06.0X9A

## 2018-04-01 HISTORY — DX: Gastro-esophageal reflux disease without esophagitis: K21.9

## 2018-04-01 HISTORY — DX: Concussion with loss of consciousness status unknown, initial encounter: S06.0XAA

## 2018-04-01 HISTORY — DX: Unspecified intracranial injury with loss of consciousness of unspecified duration, initial encounter: S06.9X9A

## 2018-04-01 HISTORY — DX: Multiple fractures of ribs, unspecified side, initial encounter for closed fracture: S22.49XA

## 2018-04-01 HISTORY — DX: Anxiety disorder, unspecified: F41.9

## 2018-04-01 HISTORY — PX: ORIF CLAVICULAR FRACTURE: SHX5055

## 2018-04-01 SURGERY — OPEN REDUCTION INTERNAL FIXATION (ORIF) CLAVICULAR FRACTURE
Anesthesia: General | Laterality: Left

## 2018-04-01 MED ORDER — CEFAZOLIN SODIUM-DEXTROSE 2-4 GM/100ML-% IV SOLN
INTRAVENOUS | Status: AC
  Filled 2018-04-01: qty 100

## 2018-04-01 MED ORDER — OXYCODONE HCL 5 MG PO TABS
10.0000 mg | ORAL_TABLET | Freq: Once | ORAL | Status: AC
  Administered 2018-04-01: 10 mg via ORAL

## 2018-04-01 MED ORDER — ONDANSETRON HCL 4 MG/2ML IJ SOLN
INTRAMUSCULAR | Status: DC | PRN
  Administered 2018-04-01: 4 mg via INTRAVENOUS

## 2018-04-01 MED ORDER — BUPIVACAINE-EPINEPHRINE (PF) 0.25% -1:200000 IJ SOLN
INTRAMUSCULAR | Status: AC
  Filled 2018-04-01: qty 30

## 2018-04-01 MED ORDER — LACTATED RINGERS IV SOLN
INTRAVENOUS | Status: DC
  Administered 2018-04-01 (×2): via INTRAVENOUS

## 2018-04-01 MED ORDER — DEXAMETHASONE SODIUM PHOSPHATE 10 MG/ML IJ SOLN
INTRAMUSCULAR | Status: AC
  Filled 2018-04-01: qty 1

## 2018-04-01 MED ORDER — DEXAMETHASONE SODIUM PHOSPHATE 10 MG/ML IJ SOLN
INTRAMUSCULAR | Status: DC | PRN
  Administered 2018-04-01: 10 mg via INTRAVENOUS

## 2018-04-01 MED ORDER — PROPOFOL 10 MG/ML IV BOLUS
INTRAVENOUS | Status: AC
  Filled 2018-04-01: qty 20

## 2018-04-01 MED ORDER — LIDOCAINE 2% (20 MG/ML) 5 ML SYRINGE
INTRAMUSCULAR | Status: AC
  Filled 2018-04-01: qty 5

## 2018-04-01 MED ORDER — OXYCODONE HCL 10 MG PO TABS: 10.0000 mg | ORAL_TABLET | Freq: Four times a day (QID) | ORAL | 0 refills | Status: DC | PRN

## 2018-04-01 MED ORDER — CEFAZOLIN SODIUM-DEXTROSE 2-4 GM/100ML-% IV SOLN
2.0000 g | INTRAVENOUS | Status: AC
  Administered 2018-04-01: 2 g via INTRAVENOUS

## 2018-04-01 MED ORDER — HYDROMORPHONE HCL 1 MG/ML IJ SOLN
INTRAMUSCULAR | Status: AC
  Administered 2018-04-01: 0.5 mg via INTRAVENOUS
  Filled 2018-04-01: qty 1

## 2018-04-01 MED ORDER — HYDROMORPHONE HCL 1 MG/ML IJ SOLN
0.2500 mg | INTRAMUSCULAR | Status: DC | PRN
  Administered 2018-04-01 (×4): 0.5 mg via INTRAVENOUS

## 2018-04-01 MED ORDER — FENTANYL CITRATE (PF) 250 MCG/5ML IJ SOLN
INTRAMUSCULAR | Status: AC
  Filled 2018-04-01: qty 5

## 2018-04-01 MED ORDER — OXYCODONE HCL 5 MG PO TABS
ORAL_TABLET | ORAL | Status: AC
  Administered 2018-04-01: 10 mg via ORAL
  Filled 2018-04-01: qty 2

## 2018-04-01 MED ORDER — LIDOCAINE 2% (20 MG/ML) 5 ML SYRINGE
INTRAMUSCULAR | Status: DC | PRN
  Administered 2018-04-01: 60 mg via INTRAVENOUS

## 2018-04-01 MED ORDER — ROCURONIUM BROMIDE 10 MG/ML (PF) SYRINGE
PREFILLED_SYRINGE | INTRAVENOUS | Status: DC | PRN
  Administered 2018-04-01 (×2): 20 mg via INTRAVENOUS
  Administered 2018-04-01: 50 mg via INTRAVENOUS
  Administered 2018-04-01: 10 mg via INTRAVENOUS

## 2018-04-01 MED ORDER — 0.9 % SODIUM CHLORIDE (POUR BTL) OPTIME
TOPICAL | Status: DC | PRN
  Administered 2018-04-01: 1000 mL

## 2018-04-01 MED ORDER — POVIDONE-IODINE 10 % EX SWAB: 2.0000 "application " | Freq: Once | CUTANEOUS | Status: DC

## 2018-04-01 MED ORDER — BUPIVACAINE-EPINEPHRINE 0.25% -1:200000 IJ SOLN
INTRAMUSCULAR | Status: DC | PRN
  Administered 2018-04-01: 20 mL

## 2018-04-01 MED ORDER — SUGAMMADEX SODIUM 200 MG/2ML IV SOLN
INTRAVENOUS | Status: AC
  Filled 2018-04-01: qty 2

## 2018-04-01 MED ORDER — ROCURONIUM BROMIDE 10 MG/ML (PF) SYRINGE
PREFILLED_SYRINGE | INTRAVENOUS | Status: AC
  Filled 2018-04-01: qty 5

## 2018-04-01 MED ORDER — FENTANYL CITRATE (PF) 250 MCG/5ML IJ SOLN
INTRAMUSCULAR | Status: DC | PRN
  Administered 2018-04-01 (×3): 50 ug via INTRAVENOUS
  Administered 2018-04-01: 100 ug via INTRAVENOUS

## 2018-04-01 MED ORDER — SUGAMMADEX SODIUM 200 MG/2ML IV SOLN
INTRAVENOUS | Status: DC | PRN
  Administered 2018-04-01: 200 mg via INTRAVENOUS

## 2018-04-01 MED ORDER — MIDAZOLAM HCL 2 MG/2ML IJ SOLN
INTRAMUSCULAR | Status: DC | PRN
  Administered 2018-04-01: 2 mg via INTRAVENOUS

## 2018-04-01 MED ORDER — ONDANSETRON HCL 4 MG/2ML IJ SOLN
INTRAMUSCULAR | Status: AC
  Filled 2018-04-01: qty 2

## 2018-04-01 MED ORDER — TRAMADOL HCL 50 MG PO TABS: 50.0000 mg | ORAL_TABLET | Freq: Four times a day (QID) | ORAL | 0 refills | Status: DC | PRN

## 2018-04-01 MED ORDER — SENNA-DOCUSATE SODIUM 8.6-50 MG PO TABS: 2.0000 | ORAL_TABLET | Freq: Every day | ORAL | 1 refills | Status: DC

## 2018-04-01 MED ORDER — PROPOFOL 10 MG/ML IV BOLUS
INTRAVENOUS | Status: DC | PRN
  Administered 2018-04-01: 200 mg via INTRAVENOUS

## 2018-04-01 MED ORDER — CHLORHEXIDINE GLUCONATE 4 % EX LIQD: 60.0000 mL | Freq: Once | CUTANEOUS | Status: DC

## 2018-04-01 MED ORDER — MIDAZOLAM HCL 2 MG/2ML IJ SOLN
INTRAMUSCULAR | Status: AC
  Filled 2018-04-01: qty 2

## 2018-04-01 MED ORDER — DEXMEDETOMIDINE HCL 200 MCG/2ML IV SOLN
INTRAVENOUS | Status: DC | PRN
  Administered 2018-04-01 (×5): 4 ug via INTRAVENOUS

## 2018-04-01 SURGICAL SUPPLY — 61 items
BENZOIN TINCTURE PRP APPL 2/3 (GAUZE/BANDAGES/DRESSINGS) ×3 IMPLANT
BIT DRILL 2.0 LNG QUCK RELEASE (BIT) ×1 IMPLANT
BIT DRILL 2.3 QUICK RELEASE (BIT) ×1 IMPLANT
BIT DRILL 2.8X5 QR DISP (BIT) ×3 IMPLANT
CLEANER TIP ELECTROSURG 2X2 (MISCELLANEOUS) ×3 IMPLANT
CLOSURE STERI-STRIP 1/2X4 (GAUZE/BANDAGES/DRESSINGS) ×1
CLSR STERI-STRIP ANTIMIC 1/2X4 (GAUZE/BANDAGES/DRESSINGS) ×2 IMPLANT
COVER SURGICAL LIGHT HANDLE (MISCELLANEOUS) ×3 IMPLANT
DECANTER SPIKE VIAL GLASS SM (MISCELLANEOUS) ×3 IMPLANT
DRAPE C-ARM 42X72 X-RAY (DRAPES) ×3 IMPLANT
DRAPE IMP U-DRAPE 54X76 (DRAPES) ×3 IMPLANT
DRAPE INCISE IOBAN 66X45 STRL (DRAPES) ×3 IMPLANT
DRAPE U-SHAPE 47X51 STRL (DRAPES) ×3 IMPLANT
DRILL 2.0 LNG QUICK RELEASE (BIT) ×3
DRILL 2.3 QUICK RELEASE (BIT) ×3
DRSG MEPILEX BORDER 4X8 (GAUZE/BANDAGES/DRESSINGS) ×3 IMPLANT
DURAPREP 26ML APPLICATOR (WOUND CARE) ×3 IMPLANT
ELECT REM PT RETURN 9FT ADLT (ELECTROSURGICAL) ×3
ELECTRODE REM PT RTRN 9FT ADLT (ELECTROSURGICAL) ×1 IMPLANT
GAUZE SPONGE 4X4 12PLY STRL (GAUZE/BANDAGES/DRESSINGS) ×6 IMPLANT
GAUZE XEROFORM 1X8 LF (GAUZE/BANDAGES/DRESSINGS) ×3 IMPLANT
GLOVE BIOGEL PI ORTHO PRO SZ8 (GLOVE) ×2
GLOVE ORTHO TXT STRL SZ7.5 (GLOVE) ×6 IMPLANT
GLOVE PI ORTHO PRO STRL SZ8 (GLOVE) ×1 IMPLANT
GLOVE SURG ORTHO 8.0 STRL STRW (GLOVE) ×6 IMPLANT
GOWN STRL REUS W/ TWL LRG LVL3 (GOWN DISPOSABLE) ×4 IMPLANT
GOWN STRL REUS W/TWL LRG LVL3 (GOWN DISPOSABLE) ×8
KIT BASIN OR (CUSTOM PROCEDURE TRAY) ×3 IMPLANT
KIT TURNOVER KIT B (KITS) ×3 IMPLANT
MANIFOLD NEPTUNE II (INSTRUMENTS) ×3 IMPLANT
NEEDLE HYPO 25GX1X1/2 BEV (NEEDLE) ×3 IMPLANT
NS IRRIG 1000ML POUR BTL (IV SOLUTION) ×3 IMPLANT
PACK SHOULDER (CUSTOM PROCEDURE TRAY) ×3 IMPLANT
PACK UNIVERSAL I (CUSTOM PROCEDURE TRAY) ×3 IMPLANT
PAD ARMBOARD 7.5X6 YLW CONV (MISCELLANEOUS) ×9 IMPLANT
PLATE LEFT CLAVICLE 8H (Plate) ×3 IMPLANT
SCREW CORTICAL 2.3X14 (Screw) ×3 IMPLANT
SCREW HEXALOBE LOCKING 3.5X14M (Screw) ×3 IMPLANT
SCREW HEXALOBE LOCKING 3.5X16M (Screw) ×3 IMPLANT
SCREW HEXALOBE NON-LOCK 3.5X14 (Screw) ×9 IMPLANT
SCREW HEXALOBE NON-LOCK 3.5X16 (Screw) ×3 IMPLANT
SCREW LOCK 12X3.5X HEXALOBE (Screw) ×1 IMPLANT
SCREW LOCKING 3.5X12 (Screw) ×2 IMPLANT
SLING ARM FOAM STRAP XLG (SOFTGOODS) ×3 IMPLANT
SPONGE LAP 4X18 X RAY DECT (DISPOSABLE) IMPLANT
STAPLER VISISTAT 35W (STAPLE) IMPLANT
SUCTION FRAZIER HANDLE 10FR (MISCELLANEOUS) ×2
SUCTION TUBE FRAZIER 10FR DISP (MISCELLANEOUS) ×1 IMPLANT
SUT FIBERWIRE #2 38 T-5 BLUE (SUTURE)
SUT VIC AB 0 CT1 27 (SUTURE) ×2
SUT VIC AB 0 CT1 27XBRD ANBCTR (SUTURE) ×1 IMPLANT
SUT VIC AB 0 CTB1 27 (SUTURE) IMPLANT
SUT VIC AB 2-0 CT1 36 (SUTURE) ×6 IMPLANT
SUT VIC AB 3-0 FS2 27 (SUTURE) IMPLANT
SUT VIC AB 3-0 SH 8-18 (SUTURE) ×3 IMPLANT
SUTURE FIBERWR #2 38 T-5 BLUE (SUTURE) IMPLANT
SYR BULB IRRIGATION 50ML (SYRINGE) ×3 IMPLANT
SYR CONTROL 10ML LL (SYRINGE) ×3 IMPLANT
TOWEL OR 17X24 6PK STRL BLUE (TOWEL DISPOSABLE) ×3 IMPLANT
TOWEL OR 17X26 10 PK STRL BLUE (TOWEL DISPOSABLE) ×3 IMPLANT
WATER STERILE IRR 1000ML POUR (IV SOLUTION) ×3 IMPLANT

## 2018-04-01 NOTE — Transfer of Care (Signed)
Immediate Anesthesia Transfer of Care Note  Patient: Joe Mowroy D Frees Jr.  Procedure(s) Performed: OPEN REDUCTION INTERNAL FIXATION (ORIF) CLAVICULAR FRACTURE (Left )  Patient Location: PACU  Anesthesia Type:General  Level of Consciousness: awake, drowsy and patient cooperative  Airway & Oxygen Therapy: Patient Spontanous Breathing and Patient connected to face mask oxygen  Post-op Assessment: Report given to RN and Post -op Vital signs reviewed and stable  Post vital signs: Reviewed and stable  Last Vitals:  Vitals Value Taken Time  BP 139/82 04/01/2018 10:07 AM  Temp    Pulse 78 04/01/2018 10:07 AM  Resp 13 04/01/2018 10:07 AM  SpO2 100 % 04/01/2018 10:07 AM  Vitals shown include unvalidated device data.  Last Pain:  Vitals:   04/01/18 0623  TempSrc: Oral  PainSc: 8          Complications: No apparent anesthesia complications

## 2018-04-01 NOTE — Anesthesia Procedure Notes (Signed)
Procedure Name: Intubation Date/Time: 04/01/2018 7:56 AM Performed by: Bryson Corona, CRNA Pre-anesthesia Checklist: Patient identified, Emergency Drugs available, Suction available and Patient being monitored Patient Re-evaluated:Patient Re-evaluated prior to induction Oxygen Delivery Method: Circle System Utilized Preoxygenation: Pre-oxygenation with 100% oxygen Induction Type: IV induction Ventilation: Oral airway inserted - appropriate to patient size Laryngoscope Size: Mac and 3 Grade View: Grade I Tube type: Oral Tube size: 7.0 mm Number of attempts: 1 Airway Equipment and Method: Stylet and Oral airway Placement Confirmation: ETT inserted through vocal cords under direct vision,  positive ETCO2 and breath sounds checked- equal and bilateral Secured at: 22 cm Tube secured with: Tape Dental Injury: Teeth and Oropharynx as per pre-operative assessment

## 2018-04-01 NOTE — Discharge Instructions (Signed)
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
## 2018-04-01 NOTE — Anesthesia Postprocedure Evaluation (Signed)
Anesthesia Post Note  Patient: Joe Mowroy D Bieri Jr.  Procedure(s) Performed: OPEN REDUCTION INTERNAL FIXATION (ORIF) CLAVICULAR FRACTURE (Left )     Patient location during evaluation: PACU Anesthesia Type: General Level of consciousness: awake and alert Pain management: pain level controlled Vital Signs Assessment: post-procedure vital signs reviewed and stable Respiratory status: spontaneous breathing, nonlabored ventilation and respiratory function stable Cardiovascular status: blood pressure returned to baseline and stable Postop Assessment: no apparent nausea or vomiting Anesthetic complications: no    Last Vitals:  Vitals:   04/01/18 1045 04/01/18 1100  BP: (!) 163/96 (!) 158/95  Pulse: 91 94  Resp: 14 12  Temp:    SpO2: 98% 96%    Last Pain:  Vitals:   04/01/18 1005  TempSrc:   PainSc: 0-No pain                 Lorice Lafave,W. EDMOND

## 2018-04-03 ENCOUNTER — Encounter (HOSPITAL_COMMUNITY): Payer: Self-pay | Admitting: Orthopedic Surgery
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
## 2019-09-12 ENCOUNTER — Telehealth: Payer: No Typology Code available for payment source | Admitting: Nurse Practitioner

## 2019-09-12 DIAGNOSIS — R059 Cough, unspecified: Secondary | ICD-10-CM

## 2019-09-12 DIAGNOSIS — R05 Cough: Secondary | ICD-10-CM

## 2019-09-12 MED ORDER — DOXYCYCLINE HYCLATE 100 MG PO TABS
100.0000 mg | ORAL_TABLET | Freq: Two times a day (BID) | ORAL | 0 refills | Status: DC
Start: 2019-09-12 — End: 2022-06-20

## 2019-09-12 NOTE — Progress Notes (Signed)
We are sorry that you are not feeling well.  Here is how we plan to help!  Based on your presentation I believe you most likely have A cough due to bacteria.  When patients have a fever and a productive cough with a change in color or increased sputum production, we are concerned about bacterial bronchitis.  If left untreated it can progress to pneumonia.  If your symptoms do not improve with your treatment plan it is important that you contact your provider.   I have prescribed Doxycycline 100 mg twice a day for 7 days     In addition you may use A non-prescription cough medication called Robitussin DAC. Take 2 teaspoons every 8 hours or Delsym: take 2 teaspoons every 12 hours.   From your responses in the eVisit questionnaire you describe inflammation in the upper respiratory tract which is causing a significant cough.  This is commonly called Bronchitis and has four common causes:    Allergies  Viral Infections  Acid Reflux  Bacterial Infection Allergies, viruses and acid reflux are treated by controlling symptoms or eliminating the cause. An example might be a cough caused by taking certain blood pressure medications. You stop the cough by changing the medication. Another example might be a cough caused by acid reflux. Controlling the reflux helps control the cough.  USE OF BRONCHODILATOR ("RESCUE") INHALERS: There is a risk from using your bronchodilator too frequently.  The risk is that over-reliance on a medication which only relaxes the muscles surrounding the breathing tubes can reduce the effectiveness of medications prescribed to reduce swelling and congestion of the tubes themselves.  Although you feel brief relief from the bronchodilator inhaler, your asthma may actually be worsening with the tubes becoming more swollen and filled with mucus.  This can delay other crucial treatments, such as oral steroid medications. If you need to use a bronchodilator inhaler daily, several times per  day, you should discuss this with your provider.  There are probably better treatments that could be used to keep your asthma under control.     HOME CARE . Only take medications as instructed by your medical team. . Complete the entire course of an antibiotic. . Drink plenty of fluids and get plenty of rest. . Avoid close contacts especially the very young and the elderly . Cover your mouth if you cough or cough into your sleeve. . Always remember to wash your hands . A steam or ultrasonic humidifier can help congestion.   GET HELP RIGHT AWAY IF: . You develop worsening fever. . You become short of breath . You cough up blood. . Your symptoms persist after you have completed your treatment plan MAKE SURE YOU   Understand these instructions.  Will watch your condition.  Will get help right away if you are not doing well or get worse.  Your e-visit answers were reviewed by a board certified advanced clinical practitioner to complete your personal care plan.  Depending on the condition, your plan could have included both over the counter or prescription medications. If there is a problem please reply  once you have received a response from your provider. Your safety is important to us.  If you have drug allergies check your prescription carefully.    You can use MyChart to ask questions about today's visit, request a non-urgent call back, or ask for a work or school excuse for 24 hours related to this e-Visit. If it has been greater than 24 hours you   will need to follow up with your provider, or enter a new e-Visit to address those concerns. You will get an e-mail in the next two days asking about your experience.  I hope that your e-visit has been valuable and will speed your recovery. Thank you for using e-visits.  5-10 minutes spent reviewing and documenting in chart.

## 2022-06-20 ENCOUNTER — Other Ambulatory Visit: Payer: Self-pay

## 2022-06-20 ENCOUNTER — Emergency Department: Payer: Self-pay

## 2022-06-20 ENCOUNTER — Emergency Department
Admission: EM | Admit: 2022-06-20 | Discharge: 2022-06-20 | Disposition: A | Discharge status: Home / Self Care | Payer: Self-pay | Attending: Emergency Medicine | Admitting: Emergency Medicine

## 2022-06-20 DIAGNOSIS — S61412A Laceration without foreign body of left hand, initial encounter: Secondary | ICD-10-CM | POA: Insufficient documentation

## 2022-06-20 DIAGNOSIS — W268XXA Contact with other sharp object(s), not elsewhere classified, initial encounter: Secondary | ICD-10-CM | POA: Insufficient documentation

## 2022-06-20 DIAGNOSIS — L089 Local infection of the skin and subcutaneous tissue, unspecified: Secondary | ICD-10-CM

## 2022-06-20 DIAGNOSIS — Z23 Encounter for immunization: Secondary | ICD-10-CM | POA: Insufficient documentation

## 2022-06-20 MED ORDER — HYDROCODONE-ACETAMINOPHEN 5-325 MG PO TABS
1.0000 | ORAL_TABLET | Freq: Once | ORAL | Status: AC
  Administered 2022-06-20: 1 via ORAL
  Filled 2022-06-20: qty 1

## 2022-06-20 MED ORDER — SULFAMETHOXAZOLE-TRIMETHOPRIM 800-160 MG PO TABS: 1.0000 | ORAL_TABLET | Freq: Two times a day (BID) | ORAL | 0 refills | Status: AC

## 2022-06-20 MED ORDER — TETANUS-DIPHTH-ACELL PERTUSSIS 5-2.5-18.5 LF-MCG/0.5 IM SUSY
0.5000 mL | PREFILLED_SYRINGE | Freq: Once | INTRAMUSCULAR | Status: AC
  Administered 2022-06-20: 0.5 mL via INTRAMUSCULAR
  Filled 2022-06-20: qty 0.5

## 2022-06-20 MED ORDER — SULFAMETHOXAZOLE-TRIMETHOPRIM 800-160 MG PO TABS
1.0000 | ORAL_TABLET | Freq: Once | ORAL | Status: AC
  Administered 2022-06-20: 1 via ORAL
  Filled 2022-06-20: qty 1

## 2022-06-20 MED ORDER — LIDOCAINE HCL (PF) 1 % IJ SOLN
5.0000 mL | Freq: Once | INTRAMUSCULAR | Status: AC
  Administered 2022-06-20: 5 mL
  Filled 2022-06-20: qty 5
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
## 2022-06-20 NOTE — Discharge Instructions (Addendum)
Your x-ray is negative for any fracture or foreign body retained in the hand.  Your wound is concerning for local infection.  Take antibiotic as prescribed.  Keep the wound clean, dry, and covered.  See your provider or local urgent care for suture removal in 7 to 10 days.  Return for any signs of infection as discussed.

## 2022-06-20 NOTE — ED Provider Notes (Signed)
[facility] Emergency Department Provider Note     Event Date/Time   First MD Initiated Contact with Patient 06/20/22 2100     (approximate)   History   Laceration   HPI  Joe Certain. is a 36 y.o. male presents to the ED for hand pain and swelling after an accidental laceration late last night. He had a friend apply some Dermabond to the wound. He presents now with some redness, swelling, and minimal purulent drainage.     Physical Exam   Triage Vital Signs: ED Triage Vitals  Enc Vitals Group     BP 06/20/22 2001 (!) 163/98     Pulse Rate 06/20/22 2001 80     Resp 06/20/22 2001 16     Temp 06/20/22 2001 99.1 F (37.3 C)     Temp Source 06/20/22 2001 Oral     SpO2 06/20/22 2001 98 %     Weight 06/20/22 2001 200 lb (90.7 kg)     Height 06/20/22 2001 5\' 11"  (1.803 m)     Head Circumference --      Peak Flow --      Pain Score 06/20/22 2009 5     Pain Loc --      Pain Edu? --      Excl. in GC? --     Most recent vital signs: Vitals:   06/20/22 2001 06/20/22 2334  BP: (!) 163/98 (!) 155/90  Pulse: 80 78  Resp: 16 15  Temp: 99.1 F (37.3 C) 98.9 F (37.2 C)  SpO2: 98% 100%    General Awake, no distress.  CV:  Good peripheral perfusion.  RESP:  Normal effort.  ABD:  No distention.  MSK:  Left hand with a palmar laceration measuring approximately 5 cm.  The irregular laceration with Dermabond patch in place, show some wound dehiscence and mild seropurulent drainage.  Normal composite fist noted.   ED Results / Procedures / Treatments   Labs (all labs ordered are listed, but only abnormal results are displayed) Labs Reviewed - No data to display   EKG    RADIOLOGY  I personally viewed and evaluated these images as part of my medical decision making, as well as reviewing the written report by the radiologist.  ED Provider Interpretation: no acute findings}  DG Hand Complete Left  Result Date: 06/20/2022 CLINICAL  DATA:  Trauma EXAM: LEFT HAND - COMPLETE 3+ VIEW COMPARISON:  None Available. FINDINGS: No acute fracture or dislocation. Joint spaces and alignment are maintained. No area of erosion or osseous destruction. No unexpected radiopaque foreign body. Mild soft tissue edema. IMPRESSION: No acute fracture or dislocation. Electronically Signed   By: 08/21/2022 M.D.   On: 06/20/2022 20:43     PROCEDURES:  Critical Care performed: No  ..Laceration Repair  Date/Time: 06/20/2022 11:06 PM  Performed by: 08/21/2022, PA-C Authorized by: Lissa Hoard, PA-C   Consent:    Consent obtained:  Verbal   Consent given by:  Patient   Risks, benefits, and alternatives were discussed: yes     Risks discussed:  Infection and pain   Alternatives discussed:  No treatment Universal protocol:    Imaging studies available: yes     Site/side marked: yes     Patient identity confirmed:  Verbally with patient Anesthesia:    Anesthesia method:  Local infiltration   Local anesthetic:  Lidocaine 1% w/o epi Laceration details:    Location:  Hand   Hand location:  L palm   Length (cm):  5   Depth (mm):  5 Pre-procedure details:    Preparation:  Patient was prepped and draped in usual sterile fashion Exploration:    Limited defect created (wound extended): no     Imaging obtained: x-ray     Imaging outcome: foreign body not noted     Contaminated: no   Treatment:    Area cleansed with:  Povidone-iodine and saline   Amount of cleaning:  Extensive   Irrigation solution:  Sterile saline   Irrigation volume:  30   Irrigation method:  Syringe   Visualized foreign bodies/material removed: no     Debridement:  None   Undermining:  None   Scar revision: no   Skin repair:    Repair method:  Sutures   Suture size:  4-0   Suture material:  Nylon   Suture technique:  Simple interrupted   Number of sutures:  5 Approximation:    Approximation:  Loose Repair type:    Repair type:   Simple Post-procedure details:    Dressing:  Non-adherent dressing and bulky dressing   Procedure completion:  Tolerated well, no immediate complications Comments:     Previously applied and failing wound patch was removed and the wound was flushed copiously with saline and Betadine.  A loose wound approximation was achieved with 4 nylon sutures.    MEDICATIONS ORDERED IN ED: Medications  lidocaine (PF) (XYLOCAINE) 1 % injection 5 mL (5 mLs Infiltration Given by Other 06/20/22 2322)  Tdap (BOOSTRIX) injection 0.5 mL (0.5 mLs Intramuscular Given 06/20/22 2144)  sulfamethoxazole-trimethoprim (BACTRIM DS) 800-160 MG per tablet 1 tablet (1 tablet Oral Given 06/20/22 2322)  HYDROcodone-acetaminophen (NORCO/VICODIN) 5-325 MG per tablet 1 tablet (1 tablet Oral Given 06/20/22 2326)     IMPRESSION / MDM / ASSESSMENT AND PLAN / ED COURSE  I reviewed the triage vital signs and the nursing notes.                              Differential diagnosis includes, but is not limited to, hand contusion, hand laceration, hand fracture, foreign body  Patient's presentation is most consistent with acute complicated illness / injury requiring diagnostic workup.  Patient's diagnosis is consistent with hand laceration with infection. Patient will be discharged home with prescriptions for Bactrim DS. Patient is to follow up with his PCP for suture removal in 7-10 days, as needed or otherwise directed. Patient is given ED precautions to return to the ED for any worsening or new symptoms.     FINAL CLINICAL IMPRESSION(S) / ED DIAGNOSES   Final diagnoses:  Laceration of left hand without foreign body, initial encounter  Wound infection     Rx / DC Orders   ED Discharge Orders          Ordered    sulfamethoxazole-trimethoprim (BACTRIM DS) 800-160 MG tablet  2 times daily        06/20/22 2304             Note:  This document was prepared using Dragon voice recognition software and may include  unintentional dictation errors.    Lissa Hoard, PA-C 06/21/22 Fredirick Maudlin, MD 06/22/22 [phone]

## 2022-06-20 NOTE — ED Notes (Signed)
Pt states he lacerated palm of right hand yesterday and friend attempted to dermabond the wound. Residual dermabond and swelling noted to right palm laceration- no bleeding at this time. See also the triage note.

## 2022-12-30 ENCOUNTER — Encounter: Payer: Self-pay | Admitting: *Deleted

## 2022-12-30 ENCOUNTER — Other Ambulatory Visit: Payer: Self-pay

## 2022-12-30 ENCOUNTER — Emergency Department
Admission: EM | Admit: 2022-12-30 | Discharge: 2022-12-30 | Disposition: A | Discharge status: Home / Self Care | Payer: Self-pay | Attending: Emergency Medicine | Admitting: Emergency Medicine

## 2022-12-30 DIAGNOSIS — L02215 Cutaneous abscess of perineum: Secondary | ICD-10-CM | POA: Insufficient documentation

## 2022-12-30 MED ORDER — OXYCODONE-ACETAMINOPHEN 5-325 MG PO TABS
1.0000 | ORAL_TABLET | Freq: Once | ORAL | Status: AC
  Administered 2022-12-30: 1 via ORAL
  Filled 2022-12-30: qty 1

## 2022-12-30 MED ORDER — METRONIDAZOLE 500 MG PO TABS: 500.0000 mg | ORAL_TABLET | Freq: Three times a day (TID) | ORAL | 0 refills | Status: AC

## 2022-12-30 MED ORDER — CIPROFLOXACIN HCL 500 MG PO TABS: 500.0000 mg | ORAL_TABLET | Freq: Two times a day (BID) | ORAL | 0 refills | Status: AC

## 2022-12-30 MED ORDER — ONDANSETRON 4 MG PO TBDP: 4.0000 mg | ORAL_TABLET | Freq: Three times a day (TID) | ORAL | 0 refills | Status: AC | PRN

## 2022-12-30 MED ORDER — OXYCODONE-ACETAMINOPHEN 5-325 MG PO TABS: 1.0000 | ORAL_TABLET | Freq: Four times a day (QID) | ORAL | 0 refills | Status: AC | PRN

## 2022-12-30 MED ORDER — LIDOCAINE HCL 1 % IJ SOLN
5.0000 mL | Freq: Once | INTRAMUSCULAR | Status: AC
  Administered 2022-12-30: 5 mL
  Filled 2022-12-30: qty 10

## 2022-12-30 NOTE — ED Provider Notes (Signed)
[facility] Provider Note  Patient Contact: 11:05 PM (approximate)   History   Abscess   HPI  Joe Fowler. is a 37 y.o. male presents to the emergency department with a perennial abscess.  Patient states that he has had an abscess in a similar location in the past which is required incision and drainage.  Patient denies fever and chills but reports that his pain has been worsening progressively as the days past.  No scrotal pain or erythema.      Physical Exam   Triage Vital Signs: ED Triage Vitals  Enc Vitals Group     BP 12/30/22 2036 (!) 140/100     Pulse Rate 12/30/22 2036 97     Resp 12/30/22 2036 18     Temp 12/30/22 2036 97.8 F (36.6 C)     Temp Source 12/30/22 2036 Oral     SpO2 12/30/22 2036 98 %     Weight 12/30/22 2037 200 lb (90.7 kg)     Height 12/30/22 2037 5\' 11"  (1.803 m)     Head Circumference --      Peak Flow --      Pain Score 12/30/22 2037 5     Pain Loc --      Pain Edu? --      Excl. in [place]? --     Most recent vital signs: Vitals:   12/30/22 2036  BP: (!) 140/100  Pulse: 97  Resp: 18  Temp: 97.8 F (36.6 C)  SpO2: 98%     General: Alert and in no acute distress. Eyes:  PERRL. EOMI. Head: No acute traumatic findings ENT:      Nose: No congestion/rhinnorhea.      Mouth/Throat: Mucous membranes are moist.  Neck: No stridor. No cervical spine tenderness to palpation. Cardiovascular:  Good peripheral perfusion Respiratory: Normal respiratory effort without tachypnea or retractions. Lungs CTAB. Good air entry to the bases with no decreased or absent breath sounds. Gastrointestinal: Bowel sounds 4 quadrants. Soft and nontender to palpation. No guarding or rigidity. No palpable masses. No distention. No CVA tenderness. Musculoskeletal: Full range of motion to all extremities.  Neurologic:  No gross focal neurologic deficits are appreciated.  Skin: Patient has a 2 cm x 2 cm perineal abscess with some mild  surrounding cellulitis Other:   ED Results / Procedures / Treatments   Labs (all labs ordered are listed, but only abnormal results are displayed) Labs Reviewed - No data to display      PROCEDURES:  Critical Care performed: No  ..Incision and Drainage  Date/Time: 12/30/2022 11:06 PM  Performed by: Lannie Fields, PA-C Authorized by: Lannie Fields, PA-C   Consent:    Consent obtained:  Verbal   Risks discussed:  Bleeding and incomplete drainage Universal protocol:    Procedure explained and questions answered to patient or proxy's satisfaction: yes     Patient identity confirmed:  Verbally with patient Location:    Type:  Abscess   Location:  Anogenital   Anogenital location:  Perineum Pre-procedure details:    Skin preparation:  Povidone-iodine Sedation:    Sedation type:  None Anesthesia:    Anesthesia method:  Local infiltration Procedure type:    Complexity:  Simple Procedure details:    Ultrasound guidance: no     Drainage amount:  Moderate   Wound treatment:  Wound left open Post-procedure details:    Procedure completion:  Tolerated well, no immediate complications  MEDICATIONS ORDERED IN ED: Medications  lidocaine (XYLOCAINE) 1 % (with pres) injection 5 mL (has no administration in time range)  oxyCODONE-acetaminophen (PERCOCET/ROXICET) 5-325 MG per tablet 1 tablet (has no administration in time range)     IMPRESSION / MDM / ASSESSMENT AND PLAN / ED COURSE  I reviewed the triage vital signs and the nursing notes.                             Assessment and plan Perennial abscess 37 year old male presents to the emergency department with a perennial abscess.  Patient underwent incision and drainage without complication.  He was discharged with Cipro and Flagyl as he has recently completed a course of Augmentin.  Return precautions were given to return with new or worsening symptoms.  All patient questions were answered.      FINAL  CLINICAL IMPRESSION(S) / ED DIAGNOSES   Final diagnoses:  Perineal abscess     Rx / DC Orders   ED Discharge Orders          Ordered    ciprofloxacin (CIPRO) 500 MG tablet  2 times daily        12/30/22 2235    metroNIDAZOLE (FLAGYL) 500 MG tablet  3 times daily        12/30/22 2235    oxyCODONE-acetaminophen (PERCOCET/ROXICET) 5-325 MG tablet  Every 6 hours PRN        12/30/22 2235    ondansetron (ZOFRAN-ODT) 4 MG disintegrating tablet  Every 8 hours PRN        12/30/22 2235             Note:  This document was prepared using Dragon voice recognition software and may include unintentional dictation errors.   Vallarie Mare Wagner, PA-C 12/30/22 2308    Merlyn Lot, MD 12/30/22 2351

## 2022-12-30 NOTE — ED Triage Notes (Signed)
Pt has left perianal abscess.  Pt was treated a few weeks ago with similar sx.  No drainage.  Pt alert.

## 2022-12-30 NOTE — Discharge Instructions (Signed)
Take ciprofloxacin twice daily for the next 7 days. Take Flagyl 3 times daily for the next 7 days. You can take Zofran every 8 hours.

## 2022-12-30 NOTE — ED Notes (Signed)
Pt verbalizes understanding of discharge instructions. Opportunity for questioning and answers were provided. Pt discharged from ED to home with family.    

## 2023-03-19 ENCOUNTER — Emergency Department: Payer: 59

## 2023-03-19 ENCOUNTER — Emergency Department
Admission: EM | Admit: 2023-03-19 | Discharge: 2023-03-19 | Disposition: A | Discharge status: Home / Self Care | Payer: 59 | Attending: Emergency Medicine | Admitting: Emergency Medicine

## 2023-03-19 DIAGNOSIS — S0081XA Abrasion of other part of head, initial encounter: Secondary | ICD-10-CM | POA: Insufficient documentation

## 2023-03-19 DIAGNOSIS — T148XXA Other injury of unspecified body region, initial encounter: Secondary | ICD-10-CM

## 2023-03-19 DIAGNOSIS — Y908 Blood alcohol level of 240 mg/100 ml or more: Secondary | ICD-10-CM | POA: Insufficient documentation

## 2023-03-19 DIAGNOSIS — F10920 Alcohol use, unspecified with intoxication, uncomplicated: Secondary | ICD-10-CM

## 2023-03-19 DIAGNOSIS — F10120 Alcohol abuse with intoxication, uncomplicated: Secondary | ICD-10-CM | POA: Insufficient documentation

## 2023-03-19 DIAGNOSIS — Y92410 Unspecified street and highway as the place of occurrence of the external cause: Secondary | ICD-10-CM | POA: Insufficient documentation

## 2023-03-19 DIAGNOSIS — J45909 Unspecified asthma, uncomplicated: Secondary | ICD-10-CM | POA: Diagnosis not present

## 2023-03-19 LAB — CBC
HCT: 48.5 % (ref 39.0–52.0)
Hemoglobin: 16.6 g/dL (ref 13.0–17.0)
MCH: 32 pg (ref 26.0–34.0)
MCHC: 34.2 g/dL (ref 30.0–36.0)
MCV: 93.4 fL (ref 80.0–100.0)
Platelets: 211 10*3/uL (ref 150–400)
RBC: 5.19 MIL/uL (ref 4.22–5.81)
RDW: 13.8 % (ref 11.5–15.5)
WBC: 8.9 10*3/uL (ref 4.0–10.5)
nRBC: 0 % (ref 0.0–0.2)

## 2023-03-19 LAB — COMPREHENSIVE METABOLIC PANEL
ALT: 18 U/L (ref 0–44)
AST: 23 U/L (ref 15–41)
Albumin: 4.7 g/dL (ref 3.5–5.0)
Alkaline Phosphatase: 69 U/L (ref 38–126)
Anion gap: 13 (ref 5–15)
BUN: 9 mg/dL (ref 6–20)
CO2: 21 mmol/L — ABNORMAL LOW (ref 22–32)
Calcium: 8.8 mg/dL — ABNORMAL LOW (ref 8.9–10.3)
Chloride: 105 mmol/L (ref 98–111)
Creatinine, Ser: 0.75 mg/dL (ref 0.61–1.24)
GFR, Estimated: >60 mL/min (ref >60)
Glucose, Bld: 114 mg/dL — ABNORMAL HIGH (ref 70–99)
Potassium: 3.9 mmol/L (ref 3.5–5.1)
Sodium: 139 mmol/L (ref 135–145)
Total Bilirubin: 0.6 mg/dL (ref 0.3–1.2)
Total Protein: 7.8 g/dL (ref 6.5–8.1)

## 2023-03-19 LAB — ETHANOL: Alcohol, Ethyl (B): 254 mg/dL — ABNORMAL HIGH (ref <10)

## 2023-03-19 LAB — LIPASE, BLOOD: Lipase: 43 U/L (ref 11–51)

## 2023-03-19 MED ORDER — IOHEXOL 300 MG/ML  SOLN
100.0000 mL | Freq: Once | INTRAMUSCULAR | Status: AC | PRN
  Administered 2023-03-19: 100 mL via INTRAVENOUS

## 2023-03-19 MED ORDER — SODIUM CHLORIDE 0.9 % IV BOLUS
1000.0000 mL | Freq: Once | INTRAVENOUS | Status: AC
  Administered 2023-03-19: 1000 mL via INTRAVENOUS

## 2023-03-19 NOTE — ED Notes (Signed)
Officer present in room

## 2023-03-19 NOTE — Discharge Instructions (Signed)
You may take Tylenol and/or Ibuprofen as needed for pain.  Return to the ER for worsening symptoms, persistent vomiting, difficulty breathing or other concerns.
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
## 2024-05-09 ENCOUNTER — Other Ambulatory Visit: Payer: Self-pay

## 2024-05-09 ENCOUNTER — Emergency Department
Admission: EM | Admit: 2024-05-09 | Discharge: 2024-05-09 | Disposition: A | Discharge status: Home / Self Care | Attending: Emergency Medicine | Admitting: Emergency Medicine

## 2024-05-09 DIAGNOSIS — H5789 Other specified disorders of eye and adnexa: Secondary | ICD-10-CM | POA: Diagnosis present

## 2024-05-09 DIAGNOSIS — H1033 Unspecified acute conjunctivitis, bilateral: Secondary | ICD-10-CM | POA: Insufficient documentation

## 2024-05-09 DIAGNOSIS — J45909 Unspecified asthma, uncomplicated: Secondary | ICD-10-CM | POA: Insufficient documentation

## 2024-05-09 MED ORDER — TETRACAINE HCL 0.5 % OP SOLN
2.0000 [drp] | Freq: Once | OPHTHALMIC | Status: DC
  Filled 2024-05-09: qty 4

## 2024-05-09 MED ORDER — HYDROCODONE-ACETAMINOPHEN 5-325 MG PO TABS
1.0000 | ORAL_TABLET | Freq: Once | ORAL | Status: AC
  Administered 2024-05-09: 1 via ORAL
  Filled 2024-05-09: qty 1

## 2024-05-09 MED ORDER — FLUORESCEIN SODIUM 1 MG OP STRP
1.0000 | ORAL_STRIP | Freq: Once | OPHTHALMIC | Status: DC
  Filled 2024-05-09: qty 1

## 2024-05-09 MED ORDER — ONDANSETRON 4 MG PO TBDP
4.0000 mg | ORAL_TABLET | Freq: Once | ORAL | Status: AC
  Administered 2024-05-09: 4 mg via ORAL
  Filled 2024-05-09: qty 1

## 2024-05-09 MED ORDER — TOBRAMYCIN 0.3 % OP SOLN
2.0000 [drp] | Freq: Once | OPHTHALMIC | Status: AC
  Administered 2024-05-09: 2 [drp] via OPHTHALMIC
  Filled 2024-05-09: qty 5

## 2024-05-09 NOTE — Discharge Instructions (Signed)
 Apply Tobrex eyedrops 2 drops to each eye every 4 hours while awake x 7 days.  Return to the ER for worsening symptoms, persistent vomiting, difficulty breathing or other concerns.

## 2024-05-09 NOTE — ED Notes (Signed)
Visual Acuity  Left 20/25  Right 20/20  Both 20/20
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
## 2024-05-09 NOTE — ED Triage Notes (Signed)
 Pt to ED via POV c/o burning sensation to bilateral eyes. 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 Went home had some itchiness to eyes and went to sleep. Woke up around 0130 and had burning to eyes and redness. Unable to open eyes due to pain.
